# Patient Record
Sex: Male | Born: 1990 | Race: Black or African American | Hispanic: No | Marital: Single | Smoking: Current every day smoker
Health system: Southern US, Community
[De-identification: ages and names within clinical notes are randomized; demographics above are authoritative.]

---
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
## 2009-04-30 ENCOUNTER — Emergency Department (HOSPITAL_COMMUNITY): Admission: EM | Admit: 2009-04-30 | Discharge: 2009-04-30 | Payer: Self-pay | Admitting: Emergency Medicine

## 2011-01-06 ENCOUNTER — Emergency Department (HOSPITAL_COMMUNITY)
Admission: EM | Admit: 2011-01-06 | Discharge: 2011-01-06 | Disposition: A | Discharge status: Home / Self Care | Payer: Self-pay | Attending: Emergency Medicine | Admitting: Emergency Medicine

## 2011-01-06 ENCOUNTER — Encounter: Payer: Self-pay | Admitting: Emergency Medicine

## 2011-01-06 DIAGNOSIS — L509 Urticaria, unspecified: Secondary | ICD-10-CM | POA: Insufficient documentation

## 2011-01-06 DIAGNOSIS — F17200 Nicotine dependence, unspecified, uncomplicated: Secondary | ICD-10-CM | POA: Insufficient documentation

## 2011-01-06 LAB — DIFFERENTIAL
Basophils Absolute: 0 10*3/uL (ref 0.0–0.1)
Basophils Relative: 0 % (ref 0–1)
Eosinophils Absolute: 0.1 10*3/uL (ref 0.0–0.7)
Eosinophils Relative: 1 % (ref 0–5)
Lymphocytes Relative: 17 % (ref 12–46)
Lymphs Abs: 1.4 10*3/uL (ref 0.7–4.0)
Monocytes Absolute: 0.2 10*3/uL (ref 0.1–1.0)
Monocytes Relative: 3 % (ref 3–12)
Neutro Abs: 6.6 10*3/uL (ref 1.7–7.7)
Neutrophils Relative %: 79 % — ABNORMAL HIGH (ref 43–77)

## 2011-01-06 LAB — CBC
HCT: 39.4 % (ref 39.0–52.0)
Hemoglobin: 13 g/dL (ref 13.0–17.0)
MCH: 28.3 pg (ref 26.0–34.0)
MCHC: 33 g/dL (ref 30.0–36.0)
MCV: 85.7 fL (ref 78.0–100.0)
Platelets: 296 10*3/uL (ref 150–400)
RBC: 4.6 MIL/uL (ref 4.22–5.81)
RDW: 11.8 % (ref 11.5–15.5)
WBC: 8.3 10*3/uL (ref 4.0–10.5)

## 2011-01-06 MED ORDER — PREDNISONE 10 MG PO TABS: 20.0000 mg | ORAL_TABLET | Freq: Every day | ORAL | Status: AC

## 2011-01-06 NOTE — ED Notes (Signed)
C/o itchy whelp-like rash all over--onset Thursday p.m. After eating a [place] sandwich at [employer].  States he has also had some swelling of his oral mucosa--lungs are CTA and no SOA.

## 2011-01-06 NOTE — ED Notes (Signed)
A&ox4; in no distress; denies pain.

## 2011-01-06 NOTE — ED Provider Notes (Signed)
Scribed for Nelia Shi, MD, the patient was seen in room [facility] . This chart was scribed by Ellie Lunch. This patient's care was started at 3:26 PM.   CSN: [id number] Arrival date & time: 01/06/2011  2:54 PM  Chief Complaint  Patient presents with  . Urticaria    (Consider location/radiation/quality/duration/timing/severity/associated sxs/prior treatment) HPI Pt seen at 15:26 Bryan Kane is a 20 y.o. male who presents to the Emergency Department complaining of hives with associated itching on his trunk, arms, back, and legs starting 4 days ago after eating a [place] sandwich at [employer]. Pt reports no hx of similar symptoms. Reports that hives with resolve with Benadryl but return. Denies changing routine. Denies eating shrimp, crab, or strawberries or starting any new medication.   History reviewed. No pertinent past medical history.  History reviewed. No pertinent past surgical history.  History reviewed. No pertinent family history.  History  Substance Use Topics  . Smoking status: Current Everyday Smoker -- 3 years    Types: Cigars  . Smokeless tobacco: Never Used  . Alcohol Use: No    Review of Systems 10 Systems reviewed and are negative for acute change except as noted in the HPI.   Allergies  Review of patient's allergies indicates no known allergies.  Home Medications   Current Outpatient Rx  Name Route Sig Dispense Refill  . DIPHENHYDRAMINE HCL (SLEEP) 25 MG PO TABS Oral Take 25 mg by mouth at bedtime as needed. allergies       Pulse 80  Temp(Src) 98.7 F (37.1 C) (Oral)  Resp 16  Ht 5\' 6"  (1.676 m)  Wt 183 lb (83.008 kg)  BMI 29.54 kg/m2  SpO2 100%  Physical Exam  Nursing note and vitals reviewed. Constitutional: He is oriented to person, place, and time. He appears well-developed and well-nourished.  HENT:  Head: Normocephalic and atraumatic.  Eyes: Conjunctivae and EOM are normal.  Neck: Normal range of motion.  Cardiovascular: Normal  rate, regular rhythm and normal heart sounds.        No airway difficulty  Pulmonary/Chest: Effort normal and breath sounds normal. No respiratory distress.  Abdominal: Soft. There is no tenderness.  Musculoskeletal: Normal range of motion. He exhibits no tenderness.  Neurological: He is alert and oriented to person, place, and time.  Skin: Skin is warm and dry. Rash (Uticarial lesions on trunk, arms, legs, and back) noted.  Psychiatric: He has a normal mood and affect. Judgment normal.   Procedures (including critical care time)  OTHER DATA REVIEWED: Nursing notes, vital signs, and past medical records reviewed.  DIAGNOSTIC STUDIES: Oxygen Saturation is 100% on room air, normal by my interpretation.    LABS / RADIOLOGY:  Labs Reviewed  DIFFERENTIAL - Abnormal; Notable for the following:    Neutrophils Relative 79 (*)    All other components within normal limits  CBC    ED COURSE / COORDINATION OF CARE: 15:26 EDP at PT bedside for PE. Discussed treatment plan with PT including blood work and plan to discharge.   MDM:   SCRIBE ATTESTATION: I personally performed the services described in this documentation, which was scribed in my presence. The recorded information has been reviewed and considered. Nelia Shi, MD           Nelia Shi, MD 01/06/11 [phone]

## 2011-01-06 NOTE — ED Notes (Signed)
Patient c/o breaking out in hives on back, legs, face, neck, and head. Patient states "I keep breaking out in hives. I take benadryl and it will go away but it comes right back." Patient denies changing anything in regular routine ex soap, laundry detergent.

## 2011-10-20 ENCOUNTER — Emergency Department (HOSPITAL_COMMUNITY)
Admission: EM | Admit: 2011-10-20 | Discharge: 2011-10-21 | Disposition: A | Discharge status: Home / Self Care | Payer: Self-pay | Attending: Emergency Medicine | Admitting: Emergency Medicine

## 2011-10-20 ENCOUNTER — Emergency Department (HOSPITAL_COMMUNITY): Payer: Self-pay

## 2011-10-20 ENCOUNTER — Encounter (HOSPITAL_COMMUNITY): Payer: Self-pay | Admitting: *Deleted

## 2011-10-20 DIAGNOSIS — W268XXA Contact with other sharp object(s), not elsewhere classified, initial encounter: Secondary | ICD-10-CM | POA: Insufficient documentation

## 2011-10-20 DIAGNOSIS — S51809A Unspecified open wound of unspecified forearm, initial encounter: Secondary | ICD-10-CM | POA: Insufficient documentation

## 2011-10-20 DIAGNOSIS — S51811A Laceration without foreign body of right forearm, initial encounter: Secondary | ICD-10-CM

## 2011-10-20 MED ORDER — TETANUS-DIPHTH-ACELL PERTUSSIS 5-2.5-18.5 LF-MCG/0.5 IM SUSP
0.5000 mL | Freq: Once | INTRAMUSCULAR | Status: AC
  Administered 2011-10-20: 0.5 mL via INTRAMUSCULAR
  Filled 2011-10-20: qty 0.5

## 2011-10-20 MED ORDER — LIDOCAINE-EPINEPHRINE (PF) 1 %-1:200000 IJ SOLN
10.0000 mL | Freq: Once | INTRAMUSCULAR | Status: AC
  Administered 2011-10-20: 10 mL
  Filled 2011-10-20: qty 10

## 2011-10-20 MED ORDER — IBUPROFEN 800 MG PO TABS
800.0000 mg | ORAL_TABLET | Freq: Once | ORAL | Status: AC
  Administered 2011-10-21: 800 mg via ORAL
  Filled 2011-10-20: qty 1

## 2011-10-20 NOTE — ED Notes (Signed)
Lac to rt forearm, walked into glass door, Thinks glass is in it.

## 2011-10-22 NOTE — ED Provider Notes (Signed)
Medical screening examination/treatment/procedure(s) were performed by non-physician practitioner and as supervising physician I was immediately available for consultation/collaboration.   Laray Anger, DO 10/22/11 1512

## 2011-10-22 NOTE — ED Provider Notes (Signed)
History     CSN: [id number]  Arrival date & time 10/20/11  1955   First MD Initiated Contact with Patient 10/20/11 2135      Chief Complaint  Patient presents with  . Arm Injury    (Consider location/radiation/quality/duration/timing/severity/associated sxs/prior treatment) HPI Comments: Bryan Kane presents with lacerations to his right forearm he sustained when he walked through his storm door,  Causing broken glass injury.  He denies numbness or weakness in his right arm,  He has sustained 2 lacerations,  One on the right dorsal mid forearm which is a puncture wound,  And a larger laceration at his volar elbow.  The elbow wound continues to ooze.  He denies any significant pain at this time.  His last tetanus was 10 years ago.  Patient is a 21 y.o. male presenting with arm injury. The history is provided by the patient.  Arm Injury  Pertinent negatives include no numbness.    History reviewed. No pertinent past medical history.  History reviewed. No pertinent past surgical history.  History reviewed. No pertinent family history.  History  Substance Use Topics  . Smoking status: Current Everyday Smoker -- 3 years    Types: Cigars  . Smokeless tobacco: Never Used  . Alcohol Use: No      Review of Systems  Constitutional: Negative for fever and chills.  HENT: Negative for facial swelling.   Respiratory: Negative for shortness of breath and wheezing.   Skin: Positive for wound.  Neurological: Negative for numbness.    Allergies  Review of patient's allergies indicates no known allergies.  Home Medications  No current outpatient prescriptions on file.  BP 137/84  Pulse 66  Resp 20  Ht 5' 6.5" (1.689 m)  Wt 170 lb (77.111 kg)  BMI 27.03 kg/m2  SpO2 100%  Physical Exam  Constitutional: He is oriented to person, place, and time. He appears well-developed and well-nourished.  HENT:  Head: Normocephalic.  Cardiovascular: Normal rate.   Pulmonary/Chest:  Effort normal.  Musculoskeletal: He exhibits tenderness.       Arms:      1 cm laceration posterior elbow,  Subcutaneous.  Puncture wound dorsal right midforearm,  Also appears subcutaneous in depth, both hemostatic at time of exam.  No palpable foreign body.  Neurological: He is alert and oriented to person, place, and time. No sensory deficit.  Skin: Laceration noted.    ED Course  Procedures (including critical care time)  Labs Reviewed - No data to display Dg Forearm Right  10/20/2011  *RADIOLOGY REPORT*  Clinical Data: Multiple lacerations to the right forearm.  The patient wall through glass door.  Assess for foreign body.  RIGHT FOREARM - 2 VIEW  Comparison: None.  Findings: Soft tissue lacerations in the lateral aspect of the upper forearm region.  Subcutaneous gas densities are demonstrated. No radiopaque foreign bodies are visualized.  Underlying bones appear intact.  No evidence of acute fracture or subluxation.  No focal bone lesion or bone destruction.  The  IMPRESSION: Soft tissue lacerations.  No radiopaque foreign bodies appreciated.  Original Report Authenticated By: Marlon Pel, M.D.     1. Laceration of right forearm       MDM  xrays reviewed,  No glass foreign body appreciated.  Suture removal in 10 days.  Recheck sooner for any signs of infection.  Tetanus updated,  Dressing applied to wounds.  LACERATION REPAIR  #1 Performed by: Burgess Amor Authorized by: Burgess Amor Consent: Verbal consent obtained.  Risks and benefits: risks, benefits and alternatives were discussed Consent given by: patient Patient identity confirmed: provided demographic data Prepped and Draped in normal sterile fashion Wound explored  Laceration Location: right elbow  Laceration Length: 1cm  No Foreign Bodies seen or palpated  Anesthesia: local infiltration  Local anesthetic: lidocaine 1% with epinephrine  Anesthetic total: 1 ml  Irrigation method: syringe Amount of  cleaning: standard  Skin closure: ethilon 4-0  Number of sutures: 4  Technique: simple interrupted  Patient tolerance: Patient tolerated the procedure well with no immediate complications.     LACERATION REPAIR  #2 Performed by: Burgess Amor Authorized by: Burgess Amor Consent: Verbal consent obtained. Risks and benefits: risks, benefits and alternatives were discussed Consent given by: patient Patient identity confirmed: provided demographic data Prepped and Draped in normal sterile fashion Wound explored  Laceration Location: right dorsal forearm  Laceration Length: 0.5cm  Puncture  No Foreign Bodies seen or palpated  Anesthesia: local infiltration  Local anesthetic: lidocaine none  Anesthetic total: none  Irrigation method: syringe Amount of cleaning: standard  Skin closure: sterile strips  Number of sutures: 2 sterile strips  Technique:   Patient tolerance: Patient tolerated the procedure well with no immediate complications.         Burgess Amor, PA 10/22/11 0205  Burgess Amor, PA 10/22/11 4540

## 2011-10-30 ENCOUNTER — Emergency Department (HOSPITAL_COMMUNITY)
Admission: EM | Admit: 2011-10-30 | Discharge: 2011-10-30 | Disposition: A | Discharge status: Home / Self Care | Payer: Self-pay | Attending: Pediatrics | Admitting: Pediatrics

## 2011-10-30 ENCOUNTER — Encounter (HOSPITAL_COMMUNITY): Payer: Self-pay | Admitting: Emergency Medicine

## 2011-10-30 DIAGNOSIS — IMO0002 Reserved for concepts with insufficient information to code with codable children: Secondary | ICD-10-CM

## 2011-10-30 DIAGNOSIS — F17200 Nicotine dependence, unspecified, uncomplicated: Secondary | ICD-10-CM | POA: Insufficient documentation

## 2011-10-30 DIAGNOSIS — Z4802 Encounter for removal of sutures: Secondary | ICD-10-CM | POA: Insufficient documentation

## 2011-10-30 NOTE — ED Provider Notes (Signed)
History     CSN: [id number]  Arrival date & time 10/30/11  1259   First MD Initiated Contact with Patient 10/30/11 1320      Chief Complaint  Patient presents with  . Suture / Staple Removal    (Consider location/radiation/quality/duration/timing/severity/associated sxs/prior treatment) Patient is a 21 y.o. male presenting with suture removal. The history is provided by the patient. No language interpreter was used.  Suture / Staple Removal  The sutures were placed 7 to 10 days ago. Treatments since wound repair include antibiotic ointment use. His temperature was unmeasured prior to arrival. There has been no drainage from the wound. There is no redness present. There is no swelling present. The pain has no pain. He has no difficulty moving the affected extremity or digit.    History reviewed. No pertinent past medical history.  History reviewed. No pertinent past surgical history.  History reviewed. No pertinent family history.  History  Substance Use Topics  . Smoking status: Current Everyday Smoker -- 3 years    Types: Cigars  . Smokeless tobacco: Never Used  . Alcohol Use: No      Review of Systems  Skin: Positive for wound.  Neurological: Negative for numbness.  All other systems reviewed and are negative.    Allergies  Review of patient's allergies indicates no known allergies.  Home Medications  No current outpatient prescriptions on file.  BP 121/64  Pulse 51  Temp 98.4 F (36.9 C) (Oral)  Resp 16  Ht 5' 6.5" (1.689 m)  Wt 170 lb (77.111 kg)  BMI 27.03 kg/m2  SpO2 98%  Physical Exam  Nursing note and vitals reviewed. Constitutional: He is oriented to person, place, and time. He appears well-developed and well-nourished.  HENT:  Head: Normocephalic and atraumatic.  Eyes: EOM are normal.  Neck: Normal range of motion.  Cardiovascular: Normal rate, regular rhythm, normal heart sounds and intact distal pulses.   Pulmonary/Chest: Effort normal  and breath sounds normal. No respiratory distress.  Abdominal: Soft. He exhibits no distension. There is no tenderness.  Musculoskeletal: Normal range of motion.       Arms: Neurological: He is alert and oriented to person, place, and time.  Skin: Skin is warm and dry.  Psychiatric: He has a normal mood and affect. Judgment normal.    ED Course  Procedures (including critical care time)  Labs Reviewed - No data to display No results found.   1. Dressing change/suture removal       MDM  Return as needed.        Evalina Field, [place] 10/30/11 1328

## 2011-10-30 NOTE — ED Notes (Signed)
Stitch removal

## 2011-10-31 NOTE — ED Provider Notes (Signed)
Medical screening examination/treatment/procedure(s) were performed by non-physician practitioner and as supervising physician I was immediately available for consultation/collaboration.  Donnetta Hutching, MD 10/31/11 [phone]

## 2014-03-18 ENCOUNTER — Encounter (HOSPITAL_COMMUNITY): Payer: Self-pay | Admitting: Emergency Medicine

## 2014-03-18 ENCOUNTER — Emergency Department (HOSPITAL_COMMUNITY): Payer: Self-pay

## 2014-03-18 ENCOUNTER — Emergency Department (HOSPITAL_COMMUNITY)
Admission: EM | Admit: 2014-03-18 | Discharge: 2014-03-18 | Disposition: A | Discharge status: Home / Self Care | Payer: Self-pay | Attending: Emergency Medicine | Admitting: Emergency Medicine

## 2014-03-18 DIAGNOSIS — F32A Depression, unspecified: Secondary | ICD-10-CM

## 2014-03-18 DIAGNOSIS — F919 Conduct disorder, unspecified: Secondary | ICD-10-CM | POA: Insufficient documentation

## 2014-03-18 DIAGNOSIS — R4689 Other symptoms and signs involving appearance and behavior: Secondary | ICD-10-CM

## 2014-03-18 DIAGNOSIS — Z720 Tobacco use: Secondary | ICD-10-CM | POA: Insufficient documentation

## 2014-03-18 DIAGNOSIS — F329 Major depressive disorder, single episode, unspecified: Secondary | ICD-10-CM | POA: Insufficient documentation

## 2014-03-18 DIAGNOSIS — F1210 Cannabis abuse, uncomplicated: Secondary | ICD-10-CM | POA: Insufficient documentation

## 2014-03-18 DIAGNOSIS — R51 Headache: Secondary | ICD-10-CM | POA: Insufficient documentation

## 2014-03-18 LAB — URINALYSIS, ROUTINE W REFLEX MICROSCOPIC
Bilirubin Urine: NEGATIVE
Glucose, UA: NEGATIVE mg/dL
KETONES UR: NEGATIVE mg/dL
Leukocytes, UA: NEGATIVE
NITRITE: NEGATIVE
Protein, ur: NEGATIVE mg/dL
Specific Gravity, Urine: >1.03 — ABNORMAL HIGH (ref 1.005–1.030)
Urobilinogen, UA: 0.2 mg/dL (ref 0.0–1.0)
pH: 6 (ref 5.0–8.0)

## 2014-03-18 LAB — COMPREHENSIVE METABOLIC PANEL
ALBUMIN: 4.7 g/dL (ref 3.5–5.2)
ALT: 10 U/L (ref 0–53)
AST: 18 U/L (ref 0–37)
Alkaline Phosphatase: 70 U/L (ref 39–117)
Anion gap: 13 (ref 5–15)
BILIRUBIN TOTAL: 0.7 mg/dL (ref 0.3–1.2)
BUN: 12 mg/dL (ref 6–23)
CALCIUM: 9.8 mg/dL (ref 8.4–10.5)
CHLORIDE: 104 meq/L (ref 96–112)
CO2: 25 meq/L (ref 19–32)
CREATININE: 1.06 mg/dL (ref 0.50–1.35)
GFR calc Af Amer: >90 mL/min (ref >90)
Glucose, Bld: 123 mg/dL — ABNORMAL HIGH (ref 70–99)
Potassium: 3.7 mEq/L (ref 3.7–5.3)
SODIUM: 142 meq/L (ref 137–147)
Total Protein: 8.1 g/dL (ref 6.0–8.3)

## 2014-03-18 LAB — CBC WITH DIFFERENTIAL/PLATELET
BASOS ABS: 0 10*3/uL (ref 0.0–0.1)
BASOS PCT: 0 % (ref 0–1)
Eosinophils Absolute: 0.1 10*3/uL (ref 0.0–0.7)
Eosinophils Relative: 1 % (ref 0–5)
HCT: 40.9 % (ref 39.0–52.0)
Hemoglobin: 13.4 g/dL (ref 13.0–17.0)
LYMPHS PCT: 43 % (ref 12–46)
Lymphs Abs: 2.2 10*3/uL (ref 0.7–4.0)
MCH: 28.6 pg (ref 26.0–34.0)
MCHC: 32.8 g/dL (ref 30.0–36.0)
MCV: 87.2 fL (ref 78.0–100.0)
MONO ABS: 0.3 10*3/uL (ref 0.1–1.0)
Monocytes Relative: 6 % (ref 3–12)
NEUTROS ABS: 2.5 10*3/uL (ref 1.7–7.7)
Neutrophils Relative %: 50 % (ref 43–77)
PLATELETS: 212 10*3/uL (ref 150–400)
RBC: 4.69 MIL/uL (ref 4.22–5.81)
RDW: 12.3 % (ref 11.5–15.5)
WBC: 5 10*3/uL (ref 4.0–10.5)

## 2014-03-18 LAB — URINE MICROSCOPIC-ADD ON

## 2014-03-18 LAB — RAPID URINE DRUG SCREEN, HOSP PERFORMED
AMPHETAMINES: NOT DETECTED
BARBITURATES: NOT DETECTED
BENZODIAZEPINES: NOT DETECTED
COCAINE: NOT DETECTED
Opiates: NOT DETECTED
TETRAHYDROCANNABINOL: POSITIVE — AB

## 2014-03-18 LAB — ETHANOL

## 2014-03-18 NOTE — BH Assessment (Addendum)
Tele Assessment Note   Bryan Kane is an 23 y.o. male that was assessed this day via tele assessment per order by EDP Rancour at [facility].  Called, spoke with EDP Rancour @ 1610 to gather clinical information on the pt.  Pt presents voluntarily by suggestion of his mother.  Per ED staff, pt's mother stated pt had been having depressive sx and vague SI.  Upon assessment, pt denies this.  He denies depressive sx and denies any hx of self-harm.  He denies SI, plan or intent.  He denies HI or AVH and no delusions noted.  Pt does state that he has trouble sleeping some nights and that he is having some stress due to lack of work since he was terminated in May of this year.  Pt stated he lives with his mom, is thinking of going to [employer], and is working on and off.  Pt has no mental health hx or treatment.  Pt denies depressive sx or anxiety.  Pt does endorse SA, stating he uses marijuana and alcohol on occasion.  Pt was oriented x 4, calm, cooperative, pleasant, with euthymic mood, good eye contact, logical/coherent thought processes and appropriate affect.  Pt appeared stated age.  Pt doesn't meet inpatient psychiatric criteria at this time.  Consulted with Dahlia ByesJosephine Onuoha, NP [phone] who was in agreement with this and recommended pt be given outpatient referrals.  Consulted with EDP Rancour @ 1700 who was in agreement with pt being discharged with referrals.  Pt was offered outpatient referrals for follow up, but pt stated he could talk to his minister at church or listen to motivational speakers.  He didn't want referrals.  Pt to be discharged.  Updated ED and TTS staff.  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 No pertinent past medical history. Axis IV: economic problems, other psychosocial or environmental problems, problems with access to health care services and problems with primary support group Axis V: 51-60 moderate symptoms  Past Medical  History: History reviewed. No pertinent past medical history.  History reviewed. No pertinent past surgical history.  Family History: No family history on file.  Social History:  reports that he has been smoking Cigars.  He has never used smokeless tobacco. He reports that he drinks alcohol. He reports that he uses illicit drugs (Marijuana).  Additional Social History:  Alcohol / Drug Use Pain Medications: none Prescriptions: none Over the Counter: none History of alcohol / drug use?: Yes Longest period of sobriety (when/how long):  (unknown) Negative Consequences of Use:  (na - pt denies) Withdrawal Symptoms:  (na) Substance #1 Name of Substance 1: Marijuana 1 - Age of First Use: pt stated he didn't know 1 - Amount (size/oz): "a little bit" 1 - Frequency: "I have cut back a lot" 1 - Duration: ongoing 1 - Last Use / Amount: 2 days ago - a bowl  CIWA: CIWA-Ar BP: 176/100 mmHg Pulse Rate: 72 COWS:    PATIENT STRENGTHS: (choose at least two) Ability for insight Average or above average intelligence Communication skills General fund of knowledge Motivation for treatment/growth Physical Health Religious Affiliation Supportive family/friends Work skills  Allergies: No Known Allergies  Home Medications:  (Not in a hospital admission)  OB/GYN Status:  No LMP for male patient.  General Assessment Data Location of Assessment: [facility] Is this a Tele or Face-to-Face Assessment?: Tele Assessment Is this an Initial Assessment or a Re-assessment for this encounter?: Initial Assessment Living  Arrangements: Parent Can pt return to current living arrangement?: Yes Admission Status: Voluntary Is patient capable of signing voluntary admission?: Yes Transfer from: Acute Hospital Referral Source: Self/Family/Friend     [facility] Crisis Care Plan Living Arrangements: Parent Name of Psychiatrist: none Name of Therapist: none  Education Status Is patient currently in school?:  No Highest grade of school patient has completed: High School graduate  Risk to self with the past 6 months Suicidal Ideation: No Suicidal Intent: No Is patient at risk for suicide?: No Suicidal Plan?: No Access to Means: No What has been your use of drugs/alcohol within the last 12 months?: pt reports occ use of marijuana and alcohol Previous Attempts/Gestures: No How many times?: 0 Other Self Harm Risks: na - pt denies Triggers for Past Attempts: None known Intentional Self Injurious Behavior: None Family Suicide History: No Recent stressful life event(s): Conflict (Comment) (with mother) Persecutory voices/beliefs?: No Depression: No Depression Symptoms:  (pt denies) Substance abuse history and/or treatment for substance abuse?: Yes Suicide prevention information given to non-admitted patients: Yes  Risk to Others within the past 6 months Homicidal Ideation: No Thoughts of Harm to Others: No Current Homicidal Intent: No Current Homicidal Plan: No Access to Homicidal Means: No Identified Victim: na - pt denies History of harm to others?: No Assessment of Violence: None Noted Violent Behavior Description: na - pt calm, cooperative Does patient have access to weapons?: No Criminal Charges Pending?: No Does patient have a court date: No  Psychosis Hallucinations: None noted Delusions: None noted  Mental Status Report Appear/Hygiene: Other (Comment) (WNL) Eye Contact: Good Motor Activity: Freedom of movement, Unremarkable Speech: Logical/coherent Level of Consciousness: Alert Mood: Euthymic, Pleasant Affect: Appropriate to circumstance Anxiety Level: Minimal Thought Processes: Coherent, Relevant Judgement: Unimpaired Orientation: Person, Place, Time, Situation Obsessive Compulsive Thoughts/Behaviors: None  Cognitive Functioning Concentration: Normal Memory: Recent Intact, Remote Intact IQ: Average Insight: Fair Impulse Control: Fair Appetite: Good Weight  Loss: 0 Weight Gain: 0 Sleep: Decreased Total Hours of Sleep: 5 Vegetative Symptoms: None  ADLScreening [facility] Assessment Services) Patient's cognitive ability adequate to safely complete daily activities?: Yes Patient able to express need for assistance with ADLs?: Yes Independently performs ADLs?: Yes (appropriate for developmental age)  Prior Inpatient Therapy Prior Inpatient Therapy: No Prior Therapy Dates: na Prior Therapy Facilty/Provider(s): na Reason for Treatment: na  Prior Outpatient Therapy Prior Outpatient Therapy: No Prior Therapy Dates: na Prior Therapy Facilty/Provider(s): na Reason for Treatment: na  ADL Screening (condition at time of admission) Patient's cognitive ability adequate to safely complete daily activities?: Yes Is the patient deaf or have difficulty hearing?: No Does the patient have difficulty seeing, even when wearing glasses/contacts?: No Does the patient have difficulty concentrating, remembering, or making decisions?: No Patient able to express need for assistance with ADLs?: Yes Does the patient have difficulty dressing or bathing?: No Independently performs ADLs?: Yes (appropriate for developmental age) Does the patient have difficulty walking or climbing stairs?: No  Home Assistive Devices/Equipment Home Assistive Devices/Equipment: None    Abuse/Neglect Assessment (Assessment to be complete while patient is alone) Physical Abuse: Denies Verbal Abuse: Denies Sexual Abuse: Denies Exploitation of patient/patient's resources: Denies Self-Neglect: Denies Values / Beliefs Cultural Requests During Hospitalization: None Spiritual Requests During Hospitalization: None Consults Spiritual Care Consult Needed: No Social Work Consult Needed: No Merchant navy officer (For Healthcare) Does patient have an advance directive?: No Would patient like information on creating an advanced directive?: No - patient declined information    Additional  Information 1:1 In  Past 12 Months?: No CIRT Risk: No Elopement Risk: No Does patient have medical clearance?: Yes     Disposition:  Disposition Initial Assessment Completed for this Encounter: Yes Disposition of Patient: Referred to, Outpatient treatment Type of outpatient treatment: Adult  Bryan LaniusKristen Abdoulie Tierce, MS, [facility] Licensed Professional Counselor Therapeutic Triage Specialist [facility] Phone: [phone] Fax: [phone]  03/18/2014 4:48 PM

## 2014-03-18 NOTE — ED Notes (Signed)
Mother reports that pt has been having outburst of anger and making suicidal comments and called the police last night and they told her to bring him to the hospital if pt would voluntarily with no IVC. PT denies any SI/HI but has a hard time focusing on questions in triage. PT states he is wanting to be seen for a headache.

## 2014-03-18 NOTE — ED Provider Notes (Signed)
CSN: [id number]     Arrival date & time 03/18/14  1403 History  This chart was scribe for No att. providers found by Angelene GiovanniEmmanuella Mensah, ED Scribe. The patient was seen in room APA15/APA15 and the patient's care was started at 3:31 PM.      Chief Complaint  Patient presents with  . V70.1   The history is provided by the patient. No language interpreter was used.   HPI Comments: Bryan Kane is a 23 y.o. male who presents to the Emergency Department voluntarily without IVC paperwork. He reports that his mother called the police on him after they had an altercation last night. He denies hitting his mother. He denies SI/HI ands adds that he has not had SI/HI for about 7 months. He denies a hx of depression or bipolar disorder. He reports marijuana use and alcohol use. He reports that he has been eating and drinking properly. He also c/o intermittent HA onset one month ago. He adds that being lazy makes the HA worse. He denies any fevers, dizziness, or lightheadedness.   History reviewed. No pertinent past medical history. History reviewed. No pertinent past surgical history. No family history on file. History  Substance Use Topics  . Smoking status: Current Every Day Smoker -- 3 years    Types: Cigars  . Smokeless tobacco: Never Used  . Alcohol Use: Yes     Comment: occasionally    Review of Systems  Constitutional: Negative for fever.  Neurological: Positive for headaches. Negative for dizziness and light-headedness.  Psychiatric/Behavioral: Negative for suicidal ideas.  All other systems reviewed and are negative.   A complete 10 system review of systems was obtained and all systems are negative except as noted in the HPI and PMH.    Allergies  Review of patient's allergies indicates no known allergies.  Home Medications   Prior to Admission medications   Not on File   BP 176/100 mmHg  Pulse 72  Temp(Src) 97.9 F (36.6 C) (Oral)  Resp 16  Ht 5' 6.5" (1.689 m)  Wt 150 lb  (68.04 kg)  BMI 23.85 kg/m2  SpO2 100% Physical Exam  Constitutional: He is oriented to person, place, and time. He appears well-developed and well-nourished. No distress.  HENT:  Head: Normocephalic and atraumatic.  Mouth/Throat: Oropharynx is clear and moist. No oropharyngeal exudate.  Eyes: Conjunctivae and EOM are normal. Pupils are equal, round, and reactive to light.  Neck: Normal range of motion. Neck supple.  No meningismus.  Cardiovascular: Normal rate, regular rhythm, normal heart sounds and intact distal pulses.   No murmur heard. Pulmonary/Chest: Effort normal and breath sounds normal. No respiratory distress.  Abdominal: Soft. There is no tenderness. There is no rebound and no guarding.  Musculoskeletal: Normal range of motion. He exhibits no edema or tenderness.  Neurological: He is alert and oriented to person, place, and time. No cranial nerve deficit. He exhibits normal muscle tone. Coordination normal.  No ataxia on finger to nose bilaterally. No pronator drift. 5/5 strength throughout. CN 2-12 intact. Negative Romberg. Equal grip strength. Sensation intact. Gait is normal.   Skin: Skin is warm.  Psychiatric: He has a normal mood and affect. His behavior is normal.  Nursing note and vitals reviewed.   ED Course  Procedures (including critical care time) DIAGNOSTIC STUDIES: Oxygen Saturation is 100% on RA, normal by my interpretation.    COORDINATION OF CARE: 3:36 PM- Pt advised of plan for treatment and pt agrees.    Labs Review  Labs Reviewed  URINALYSIS, ROUTINE W REFLEX MICROSCOPIC - Abnormal; Notable for the following:    Specific Gravity, Urine >1.030 (*)    Hgb urine dipstick SMALL (*)    All other components within normal limits  URINE RAPID DRUG SCREEN (HOSP PERFORMED) - Abnormal; Notable for the following:    Tetrahydrocannabinol POSITIVE (*)    All other components within normal limits  COMPREHENSIVE METABOLIC PANEL - Abnormal; Notable for the  following:    Glucose, Bld 123 (*)    All other components within normal limits  CBC WITH DIFFERENTIAL  ETHANOL  URINE MICROSCOPIC-ADD ON    Imaging Review Ct Head Wo Contrast  03/18/2014   CLINICAL DATA:  Altered mental status starting yesterday. Confusion. Behavioral change.  EXAM: CT HEAD WITHOUT CONTRAST  TECHNIQUE: Contiguous axial images were obtained from the base of the skull through the vertex without intravenous contrast.  COMPARISON:  None.  FINDINGS: Ventricles are normal in size and configuration. No parenchymal masses or mass effect. No evidence of an infarct. No extra-axial masses or abnormal fluid collections.  There is no intracranial hemorrhage.  Visualized sinuses and mastoid air cells are clear. No skull lesion.  IMPRESSION: Normal unenhanced CT scan of the brain.   Electronically Signed   By: Amie Portlandavid  Ormond M.D.   On: 03/18/2014 17:43     EKG Interpretation None      MDM   Final diagnoses:  Behavioral change  Depression  brought in by mother with violent outbursts and vague suicidal threats. Patient denies any suicidal or homicidal thoughts currently.  Labs unremarkable. Patient seen and evaluated by TTS. He does not meet inpatient criteria. Denies any SI or SI currently. He states he has a safe place to go.  CT head negative. No suicidal or homicidal thoughts. Patient is calm and cooperative. He appears stable for outpatient followup.  BP 176/100 mmHg  Pulse 72  Temp(Src) 97.9 F (36.6 C) (Oral)  Resp 16  Ht 5' 6.5" (1.689 m)  Wt 150 lb (68.04 kg)  BMI 23.85 kg/m2  SpO2 100%   I personally performed the services described in this documentation, which was scribed in my presence. The recorded information has been reviewed and is accurate.     Glynn OctaveStephen Dea Bitting, MD 03/19/14 [phone]

## 2014-03-18 NOTE — Discharge Instructions (Signed)

## 2014-04-11 ENCOUNTER — Ambulatory Visit: Payer: Self-pay | Admitting: Internal Medicine

## 2014-04-28 ENCOUNTER — Ambulatory Visit: Payer: Self-pay | Admitting: Internal Medicine

## 2014-05-05 ENCOUNTER — Ambulatory Visit: Payer: Self-pay | Admitting: Internal Medicine

## 2018-03-04 ENCOUNTER — Emergency Department (HOSPITAL_COMMUNITY)
Admission: EM | Admit: 2018-03-04 | Discharge: 2018-03-04 | Disposition: A | Discharge status: Home / Self Care | Payer: Self-pay | Attending: Emergency Medicine | Admitting: Emergency Medicine

## 2018-03-04 ENCOUNTER — Encounter (HOSPITAL_COMMUNITY): Payer: Self-pay | Admitting: Emergency Medicine

## 2018-03-04 ENCOUNTER — Other Ambulatory Visit: Payer: Self-pay

## 2018-03-04 ENCOUNTER — Emergency Department (HOSPITAL_COMMUNITY): Payer: Self-pay

## 2018-03-04 DIAGNOSIS — Y929 Unspecified place or not applicable: Secondary | ICD-10-CM | POA: Insufficient documentation

## 2018-03-04 DIAGNOSIS — S8391XA Sprain of unspecified site of right knee, initial encounter: Secondary | ICD-10-CM | POA: Insufficient documentation

## 2018-03-04 DIAGNOSIS — F17290 Nicotine dependence, other tobacco product, uncomplicated: Secondary | ICD-10-CM | POA: Insufficient documentation

## 2018-03-04 DIAGNOSIS — Y999 Unspecified external cause status: Secondary | ICD-10-CM | POA: Insufficient documentation

## 2018-03-04 DIAGNOSIS — S8001XA Contusion of right knee, initial encounter: Secondary | ICD-10-CM | POA: Insufficient documentation

## 2018-03-04 DIAGNOSIS — Y939 Activity, unspecified: Secondary | ICD-10-CM | POA: Insufficient documentation

## 2018-03-04 DIAGNOSIS — W51XXXA Accidental striking against or bumped into by another person, initial encounter: Secondary | ICD-10-CM | POA: Insufficient documentation

## 2018-03-04 MED ORDER — IBUPROFEN 800 MG PO TABS
800.0000 mg | ORAL_TABLET | Freq: Once | ORAL | Status: AC
  Administered 2018-03-04: 800 mg via ORAL
  Filled 2018-03-04: qty 1

## 2018-03-04 MED ORDER — ACETAMINOPHEN 500 MG PO TABS
1000.0000 mg | ORAL_TABLET | Freq: Once | ORAL | Status: AC
  Administered 2018-03-04: 1000 mg via ORAL
  Filled 2018-03-04: qty 2

## 2018-03-04 NOTE — Discharge Instructions (Signed)
Your vital signs are within normal limits.  The x-ray of your knee is negative for fracture or dislocation.  The examination favors a contusion and sprain of your knee.  You have been fitted with a knee sleeve.  Please use Tylenol every 4 hours or ibuprofen every 6 hours to your knee.  Please apply ice tonight.  Please see Dr. Romeo AppleHarrison for orthopedic evaluation and management if not improving.

## 2018-03-04 NOTE — ED Provider Notes (Signed)
[facility] EMERGENCY DEPARTMENT Provider Note   CSN: [id number] Arrival date & time: 03/04/18  1618     History   Chief Complaint Chief Complaint  Patient presents with  . Knee Pain    HPI Bryan Kane is a 27 y.o. male.  The history is provided by the patient.  Knee Pain   This is a new problem. The current episode started 3 to 5 hours ago. The problem occurs constantly. The problem has been gradually worsening. The pain is present in the right knee. The quality of the pain is described as aching. The pain is moderate. Associated symptoms include limited range of motion and stiffness. The symptoms are aggravated by standing. He has tried nothing for the symptoms. There has been a history of trauma (tackled and fell on the right knee).    History reviewed. No pertinent past medical history.  There are no active problems to display for this patient.   History reviewed. No pertinent surgical history.      Home Medications    Prior to Admission medications   Not on File    Family History History reviewed. No pertinent family history.  Social History Social History   Tobacco Use  . Smoking status: Current Every Day Smoker    Years: 3.00    Types: Cigars  . Smokeless tobacco: Never Used  Substance Use Topics  . Alcohol use: Yes    Comment: occasionally  . Drug use: Yes    Types: Marijuana    Comment: 03/16/14     Allergies   Patient has no known allergies.   Review of Systems Review of Systems  Constitutional: Negative for activity change.       All ROS Neg except as noted in HPI  HENT: Negative for nosebleeds.   Eyes: Negative for photophobia and discharge.  Respiratory: Negative for cough, shortness of breath and wheezing.   Cardiovascular: Negative for chest pain and palpitations.  Gastrointestinal: Negative for abdominal pain and blood in stool.  Genitourinary: Negative for dysuria, frequency and hematuria.  Musculoskeletal: Positive for  arthralgias and stiffness. Negative for back pain and neck pain.  Skin: Negative.   Neurological: Negative for dizziness, seizures and speech difficulty.  Psychiatric/Behavioral: Negative for confusion and hallucinations.     Physical Exam Updated Vital Signs Pulse 97   Temp 98 F (36.7 C) (Oral)   Resp 16   Ht 5\' 7"  (1.702 m)   Wt 80.7 kg   SpO2 98%   BMI 27.88 kg/m   Physical Exam  Constitutional: He is oriented to person, place, and time. He appears well-developed and well-nourished.  Non-toxic appearance.  HENT:  Head: Normocephalic.  Right Ear: Tympanic membrane and external ear normal.  Left Ear: Tympanic membrane and external ear normal.  Eyes: Pupils are equal, round, and reactive to light. EOM and lids are normal.  Neck: Normal range of motion. Neck supple. Carotid bruit is not present.  Cardiovascular: Normal rate, regular rhythm, normal heart sounds, intact distal pulses and normal pulses.  Pulmonary/Chest: Breath sounds normal. No respiratory distress.  Abdominal: Soft. Bowel sounds are normal. There is no tenderness. There is no guarding.  Musculoskeletal:       Right knee: He exhibits decreased range of motion. Tenderness found. Medial joint line and lateral joint line tenderness noted.  No posterior mass of the right knee. No deformity of the quad area.  Lymphadenopathy:       Head (right side): No submandibular adenopathy present.  Head (left side): No submandibular adenopathy present.    He has no cervical adenopathy.  Neurological: He is alert and oriented to person, place, and time. He has normal strength. No cranial nerve deficit or sensory deficit.  Skin: Skin is warm and dry.  Psychiatric: He has a normal mood and affect. His speech is normal.  Nursing note and vitals reviewed.    ED Treatments / Results  Labs (all labs ordered are listed, but only abnormal results are displayed) Labs Reviewed - No data to display  EKG None  Radiology No  results found.  Procedures Procedures (including critical care time)  Medications Ordered in ED Medications - No data to display   Initial Impression / Assessment and Plan / ED Course  I have reviewed the triage vital signs and the nursing notes.  Pertinent labs & imaging results that were available during my care of the patient were reviewed by me and considered in my medical decision making (see chart for details).       Final Clinical Impressions(s) / ED Diagnoses MDM  Patient is in the custody of the police department.  Patient states that he was tackled and injured the right knee.  There is no effusion noted on examination note no visible deformity.  There is no vascular or neurologic deficits of the right lower extremity.  The x-ray is negative for fracture or dislocation.  Patient will be placed on a knee sleeve.  He will use Tylenol every 4 hours or ibuprofen every 6 hours for soreness.  He is advised to see the orthopedic specialist for additional evaluation if not improving.   Final diagnoses:  Contusion of right knee, initial encounter  Sprain of right knee, unspecified ligament, initial encounter    ED Discharge Orders    None       Ivery QualeBryant, Casimir Barcellos, PA-C 03/04/18 Virgel [id number]    McManus, Kathleen, DO 03/08/18 [phone]

## 2018-03-04 NOTE — ED Triage Notes (Signed)
Pt fell today and complaining of right knee pain.

## 2019-01-29 IMAGING — DX DG KNEE COMPLETE 4+V*R*
4 series · 4 of 4 positions shown · non-contrast
Comparison: None.

CLINICAL DATA: Pain after trauma.

EXAM:
RIGHT KNEE - COMPLETE 4+ VIEW

[knee ap (1 of 3)]
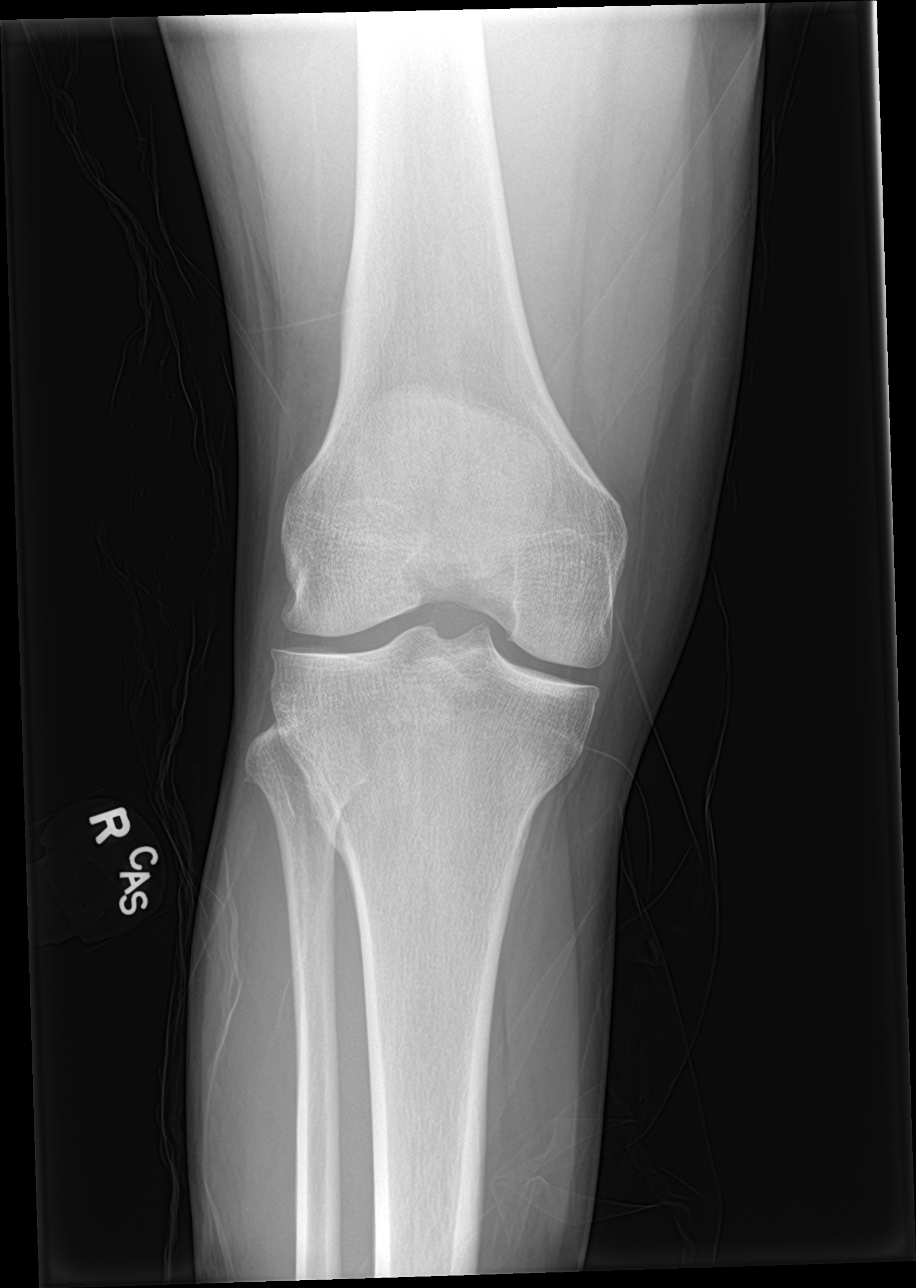

[knee lat]
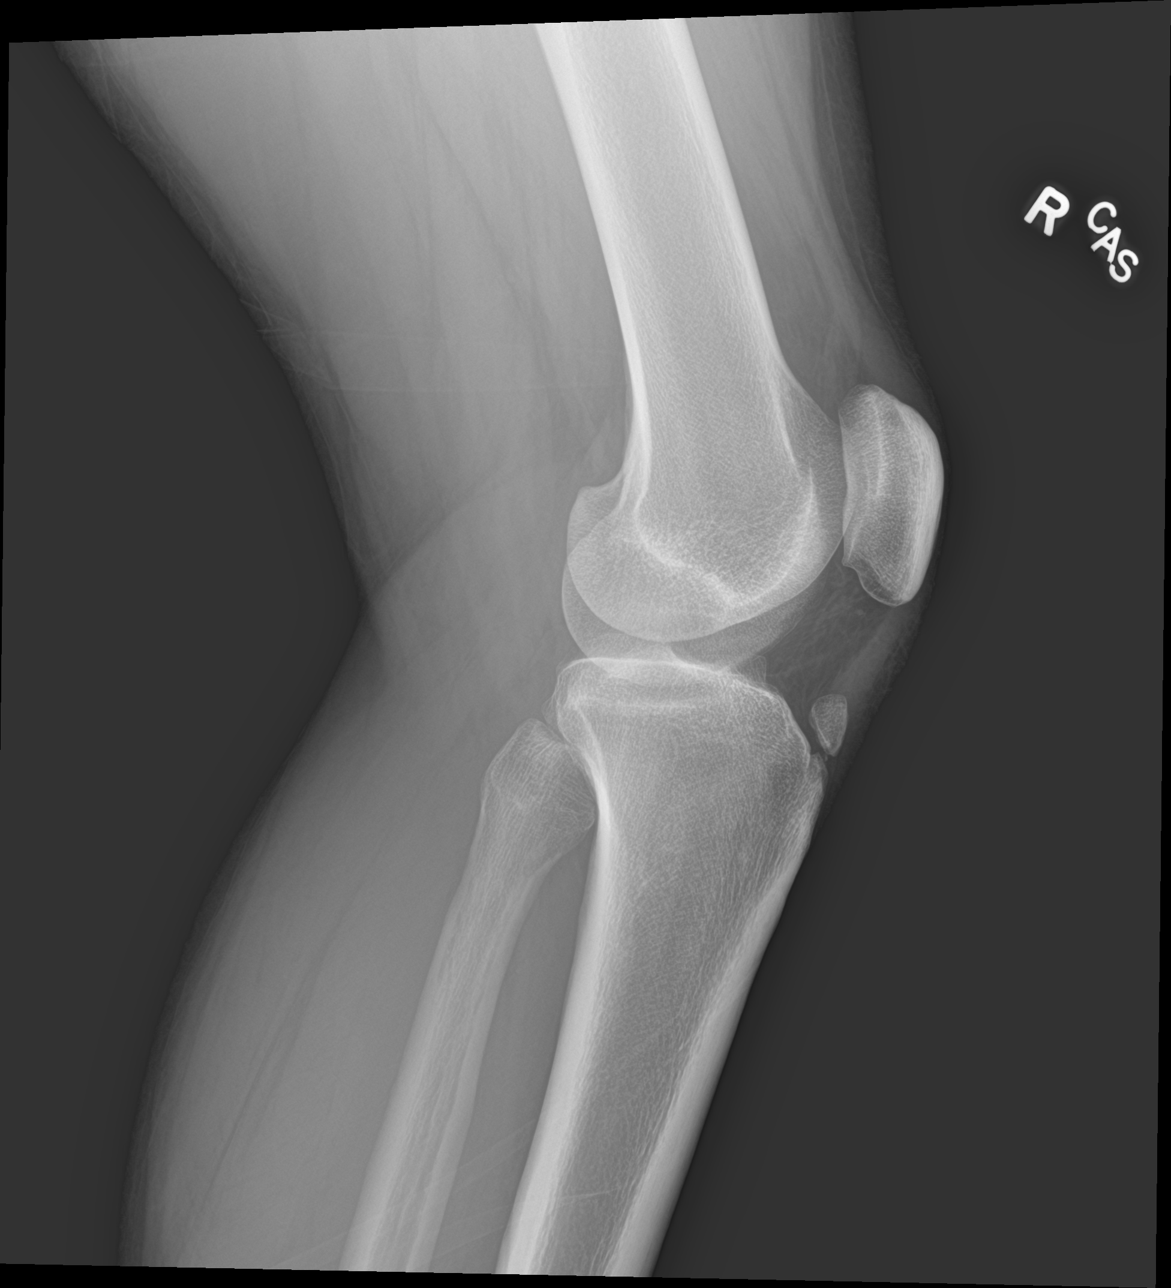

[knee ap (2 of 3)]
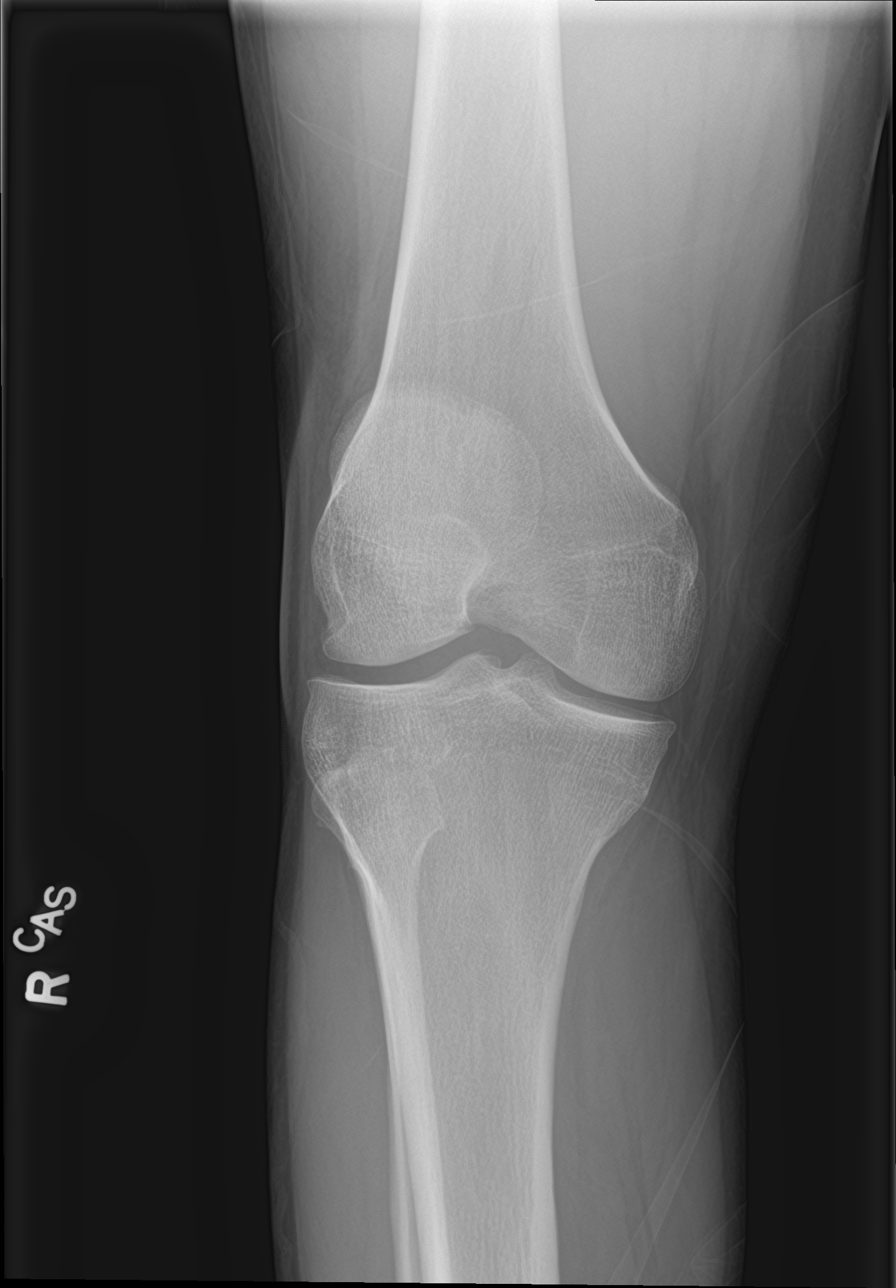

[knee ap (3 of 3)]
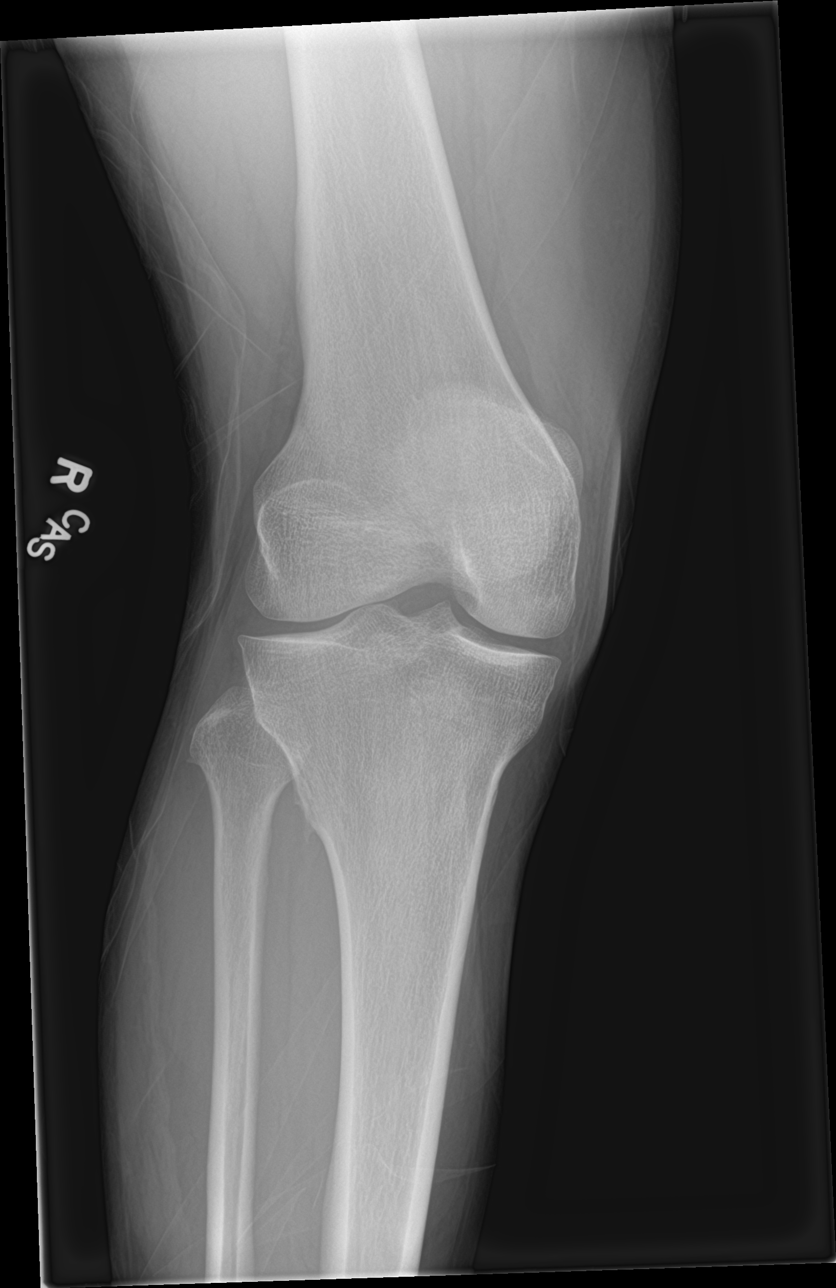

[4 of 4 positions shown; findings below may reference images not displayed]

FINDINGS: No evidence of fracture, dislocation, or joint effusion. No evidence
of arthropathy or other focal bone abnormality. Soft tissues are
unremarkable.
IMPRESSION: Negative.

## 2021-10-14 DIAGNOSIS — R69 Illness, unspecified: Secondary | ICD-10-CM | POA: Diagnosis not present
